# Patient Record
Sex: Female | Born: 1935 | Race: White | Hispanic: No | Marital: Married | Smoking: Never smoker
Health system: Southern US, Community
[De-identification: ages and names within clinical notes are randomized; demographics above are authoritative.]

## PROBLEM LIST (undated history)

## (undated) DIAGNOSIS — I639 Cerebral infarction, unspecified: Secondary | ICD-10-CM

## (undated) DIAGNOSIS — I714 Abdominal aortic aneurysm, without rupture, unspecified: Secondary | ICD-10-CM

## (undated) DIAGNOSIS — I2510 Atherosclerotic heart disease of native coronary artery without angina pectoris: Secondary | ICD-10-CM

## (undated) DIAGNOSIS — I10 Essential (primary) hypertension: Secondary | ICD-10-CM

## (undated) DIAGNOSIS — K219 Gastro-esophageal reflux disease without esophagitis: Secondary | ICD-10-CM

## (undated) DIAGNOSIS — Z87440 Personal history of urinary (tract) infections: Secondary | ICD-10-CM

## (undated) DIAGNOSIS — I4891 Unspecified atrial fibrillation: Secondary | ICD-10-CM

## (undated) HISTORY — PX: CORONARY ARTERY BYPASS GRAFT: SHX141

## (undated) HISTORY — DX: Unspecified atrial fibrillation: I48.91

## (undated) HISTORY — DX: Abdominal aortic aneurysm, without rupture, unspecified: I71.40

## (undated) HISTORY — DX: Atherosclerotic heart disease of native coronary artery without angina pectoris: I25.10

## (undated) HISTORY — DX: Essential (primary) hypertension: I10

## (undated) HISTORY — DX: Abdominal aortic aneurysm, without rupture: I71.4

## (undated) HISTORY — DX: Personal history of urinary (tract) infections: Z87.440

## (undated) HISTORY — DX: Gastro-esophageal reflux disease without esophagitis: K21.9

## (undated) HISTORY — DX: Cerebral infarction, unspecified: I63.9

---

## 2006-08-30 DIAGNOSIS — I639 Cerebral infarction, unspecified: Secondary | ICD-10-CM

## 2006-08-30 HISTORY — DX: Cerebral infarction, unspecified: I63.9

## 2009-11-19 ENCOUNTER — Ambulatory Visit: Payer: Self-pay | Admitting: Vascular Surgery

## 2009-12-10 ENCOUNTER — Ambulatory Visit: Payer: Self-pay | Admitting: Vascular Surgery

## 2009-12-10 ENCOUNTER — Encounter: Admission: RE | Admit: 2009-12-10 | Discharge: 2009-12-10 | Payer: Self-pay | Admitting: Vascular Surgery

## 2009-12-26 ENCOUNTER — Ambulatory Visit (HOSPITAL_COMMUNITY): Admission: RE | Admit: 2009-12-26 | Discharge: 2009-12-26 | Payer: Self-pay | Admitting: Vascular Surgery

## 2009-12-26 ENCOUNTER — Ambulatory Visit: Payer: Self-pay | Admitting: Vascular Surgery

## 2009-12-26 HISTORY — PX: OTHER SURGICAL HISTORY: SHX169

## 2010-01-01 ENCOUNTER — Inpatient Hospital Stay (HOSPITAL_COMMUNITY): Admission: RE | Admit: 2010-01-01 | Discharge: 2010-01-06 | Payer: Self-pay | Admitting: Vascular Surgery

## 2010-01-01 HISTORY — PX: ABDOMINAL AORTIC ANEURYSM REPAIR: SUR1152

## 2010-01-28 ENCOUNTER — Ambulatory Visit: Payer: Self-pay | Admitting: Vascular Surgery

## 2010-01-28 ENCOUNTER — Encounter: Admission: RE | Admit: 2010-01-28 | Discharge: 2010-01-28 | Payer: Self-pay | Admitting: Vascular Surgery

## 2010-07-29 IMAGING — CT CT ANGIO PELVIS
1 of 9 series · 11 of 36 positions shown, 17 images · IV contrast ([ID] OMNI 300)
Comparison: 01/01/2010 and 12/10/2009

CTA ABDOMEN

CLINICAL DATA: Postop AAA.  Constipation and diarrhea.

CT ANGIOGRAPHY ABDOMEN AND PELVIS
TECHNIQUE: Multidetector CT imaging of the abdomen and pelvis was
performed using the standard protocol during bolus administration
of intravenous contrast.  Multiplanar reconstructed images
including MIPs were obtained and reviewed to evaluate the vascular
anatomy.
Contrast:  100 ml [id number]

[Series 5: angio · axial · 0.70mm/px · z∈[-384,-12]mm · 11 of 244 slices shown, 17 images]
[im 21/244  soft-tissue]
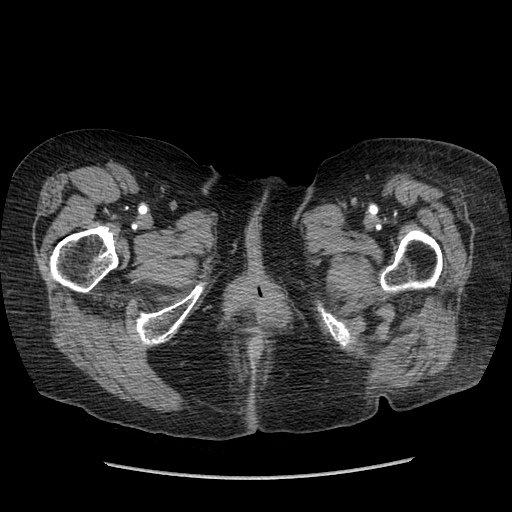
[im 21/244  bone]
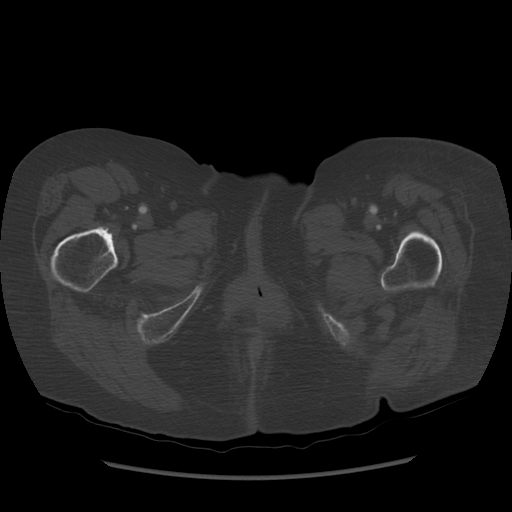
[im 41/244  soft-tissue]
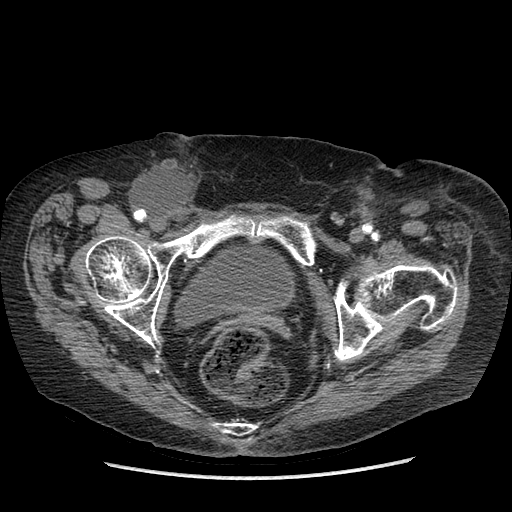
[im 61/244  soft-tissue]
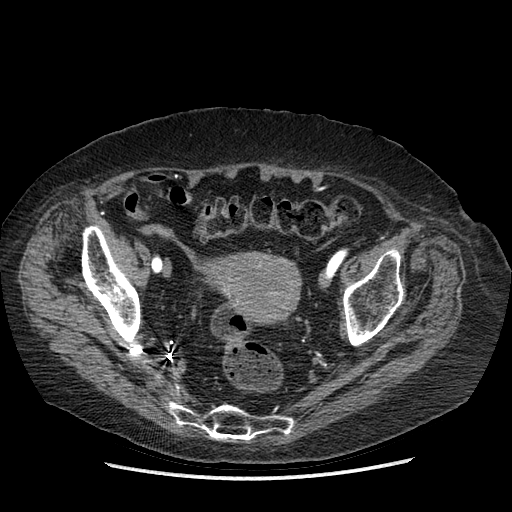
[im 82/244  soft-tissue]
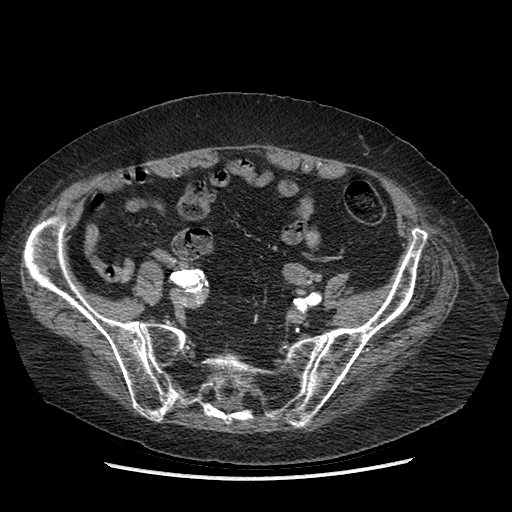
[im 102/244  soft-tissue]
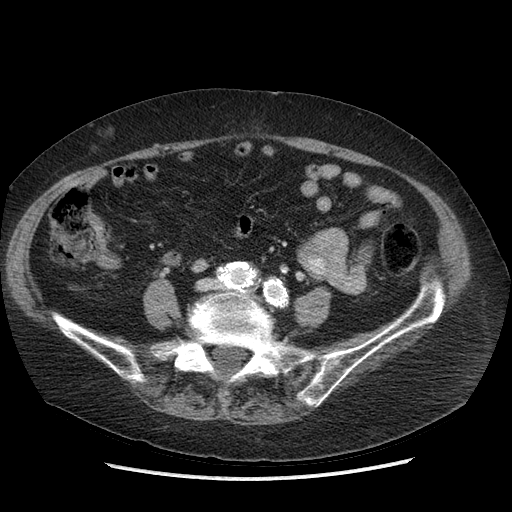
[im 122/244  soft-tissue]
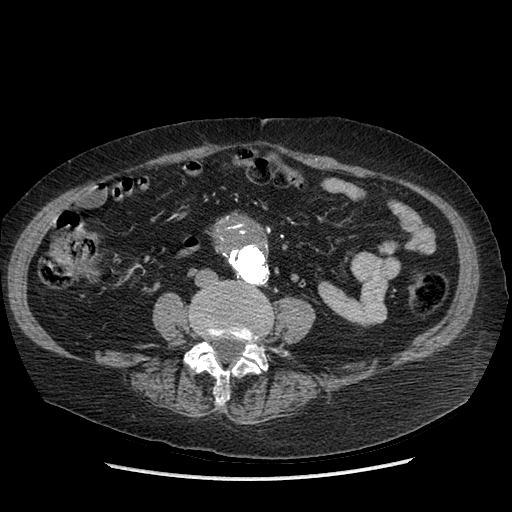
[im 142/244  soft-tissue]
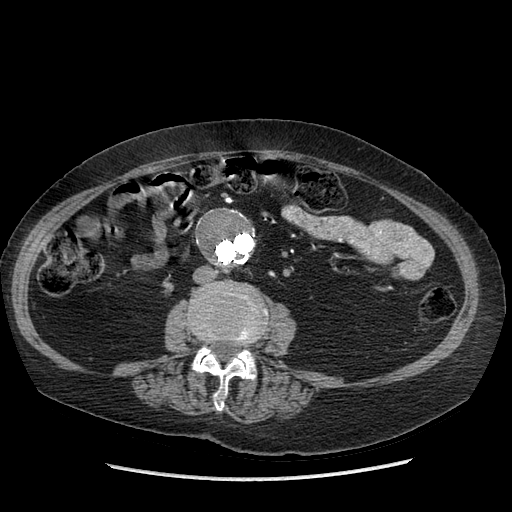
[im 163/244  soft-tissue]
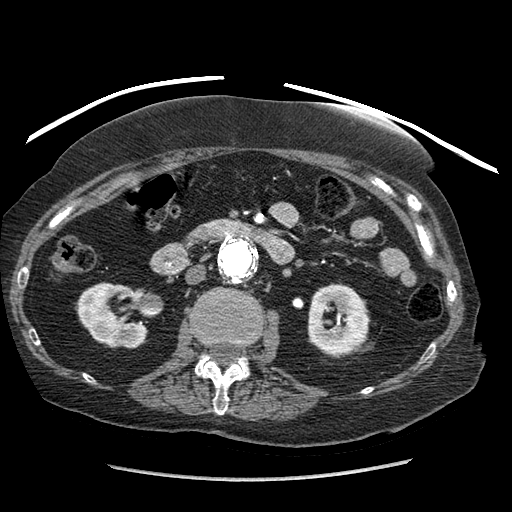
[im 163/244  lung]
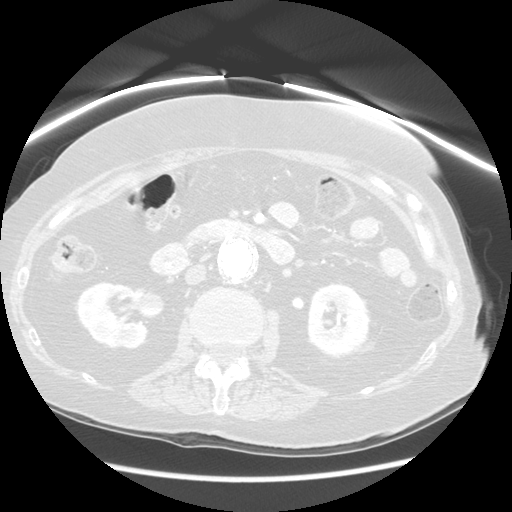
[im 183/244  soft-tissue]
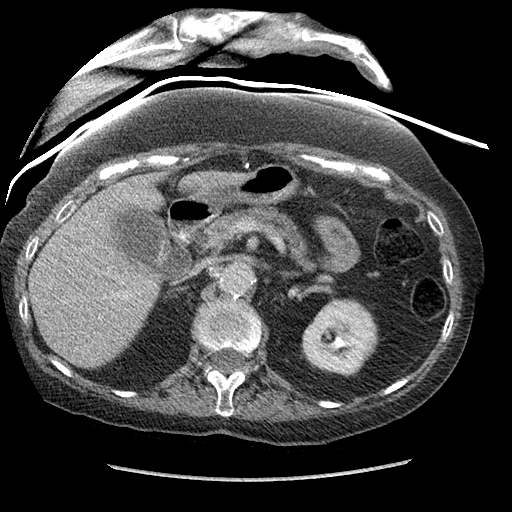
[im 183/244  lung]
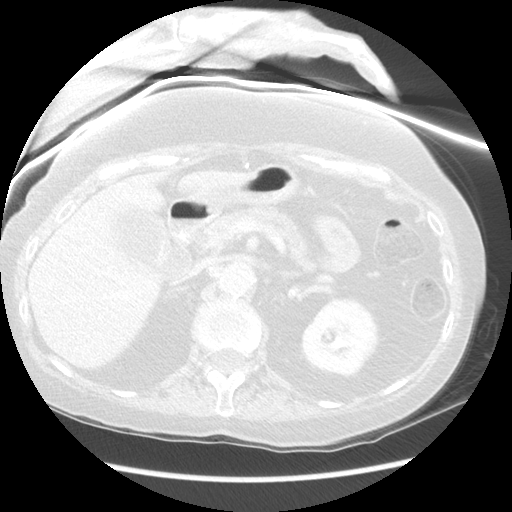
[im 183/244  bone]
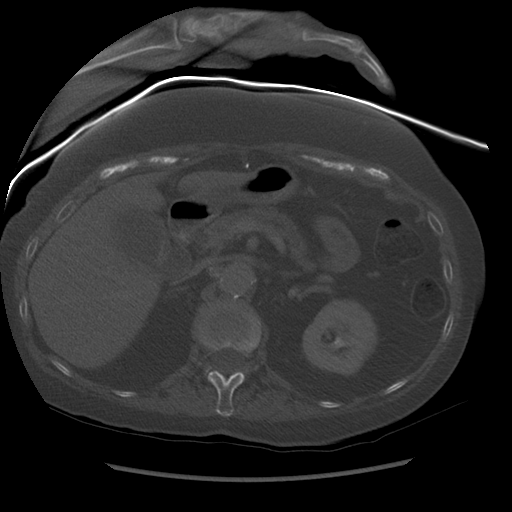
[im 203/244  soft-tissue]
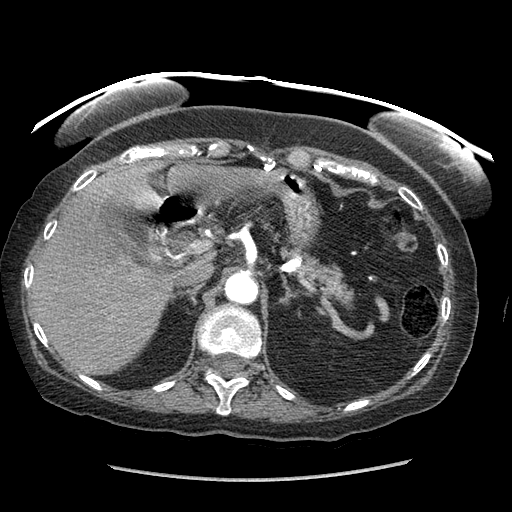
[im 203/244  lung]
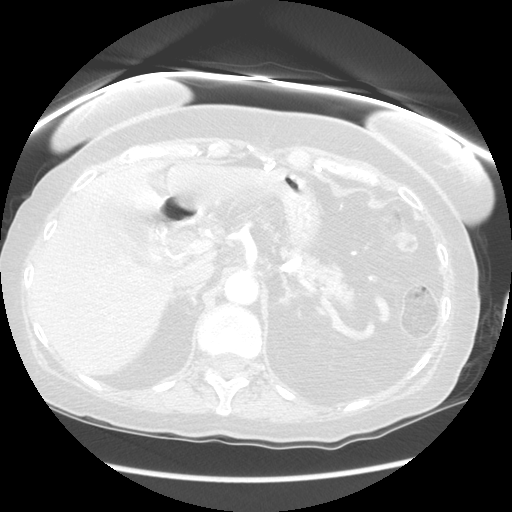
[im 223/244  soft-tissue]
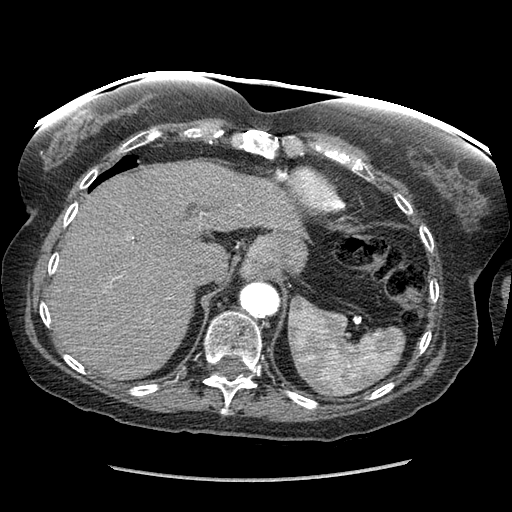
[im 223/244  lung]
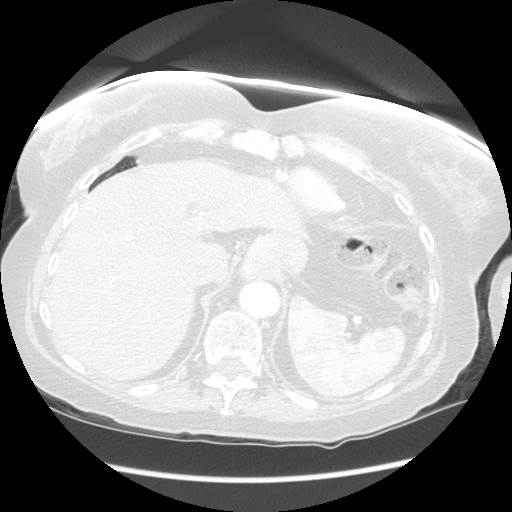

[11 of 36 positions shown; findings below may reference images not displayed]

FINDINGS: Lung bases show scattered scarring.  Heart is mildly
enlarged.  No pericardial or pleural effusion.  Small hiatal
hernia.

Liver is unremarkable.  Mild intrahepatic biliary duct dilatation.
Gallbladder is mildly distended, as before.  Extrahepatic bile duct
measures approximately 9 mm.  Adrenal glands, kidneys, spleen,
pancreas, stomach and small bowel are unremarkable.  A
periumbilical hernia contains fat.  No pathologically enlarged
lymph nodes.  No free fluid.  A nodular area of increased density
in the anterior subcutaneous fat of the right lower quadrant is
new, and may be iatrogenic in etiology.

There has been endovascular stent graft repair of the infrarenal
aorta.  The right iliac limb terminates in the right external iliac
artery.  Iliac limbs opacify symmetrically.  Native aneurysm sac
measures 4.5 x 5.1 cm (previously 4.7 x 5.2 cm).  No evidence of
endoleak.  Endovascular coils are seen in the right internal iliac
artery.  There is a thrombosed right internal iliac artery
aneurysm, measuring 2.7 cm, stable. Right common iliac artery
measures 2.3 cm, and left common iliac artery, 1.8 cm.

 Review of the MIP images confirms the above findings.
IMPRESSION: 1.  Interval endovascular stent graft repair of an infrarenal
aortic aneurysm, with slight decrease in size of the native
aneurysm sac.  No evidence of endoleak.
2.  Thrombosed right internal iliac artery aneurysm.  Right common
iliac artery aneurysm.
3.  Dilated gallbladder with mild intrahepatic and extrahepatic
biliary duct dilatation.  Please correlate clinically.
4.  Small periumbilical hernia contains fat.

CTA PELVIS
FINDINGS: There are postoperative changes in both inguinal regions.
A fluid collection in the right groin measures 4.6 x 3.9 cm and 5
HU.  Uterus and ovaries are visualized. Colon is unremarkable.  No
worrisome lytic or sclerotic lesions.  Osteopenia.

 Review of the MIP images confirms the above findings.
IMPRESSION: Fluid collection in the right groin likely represents a
postoperative seroma.

## 2010-08-13 ENCOUNTER — Ambulatory Visit: Payer: Self-pay | Admitting: Vascular Surgery

## 2010-09-20 ENCOUNTER — Encounter: Payer: Self-pay | Admitting: Vascular Surgery

## 2010-11-17 LAB — CBC
HCT: 27.3 % — ABNORMAL LOW (ref 36.0–46.0)
Hemoglobin: 11 g/dL — ABNORMAL LOW (ref 12.0–15.0)
Hemoglobin: 8 g/dL — ABNORMAL LOW (ref 12.0–15.0)
Hemoglobin: 8.6 g/dL — ABNORMAL LOW (ref 12.0–15.0)
MCHC: 33.9 g/dL (ref 30.0–36.0)
MCHC: 34 g/dL (ref 30.0–36.0)
MCV: 85.6 fL (ref 78.0–100.0)
MCV: 86.4 fL (ref 78.0–100.0)
Platelets: 183 10*3/uL (ref 150–400)
RBC: 2.97 MIL/uL — ABNORMAL LOW (ref 3.87–5.11)
RBC: 3.71 MIL/uL — ABNORMAL LOW (ref 3.87–5.11)
RDW: 15.9 % — ABNORMAL HIGH (ref 11.5–15.5)
RDW: 16 % — ABNORMAL HIGH (ref 11.5–15.5)
WBC: 5.3 10*3/uL (ref 4.0–10.5)

## 2010-11-17 LAB — BLOOD GAS, ARTERIAL
FIO2: 0.21 %
O2 Saturation: 98.5 %
Patient temperature: 98.6
pO2, Arterial: 86.8 mmHg (ref 80.0–100.0)

## 2010-11-17 LAB — BASIC METABOLIC PANEL
BUN: 6 mg/dL (ref 6–23)
CO2: 23 mEq/L (ref 19–32)
CO2: 24 mEq/L (ref 19–32)
CO2: 24 mEq/L (ref 19–32)
Calcium: 9 mg/dL (ref 8.4–10.5)
Chloride: 108 mEq/L (ref 96–112)
Chloride: 112 mEq/L (ref 96–112)
Creatinine, Ser: 0.77 mg/dL (ref 0.4–1.2)
Creatinine, Ser: 0.89 mg/dL (ref 0.4–1.2)
GFR calc Af Amer: >60 mL/min (ref >60)
GFR calc non Af Amer: >60 mL/min (ref >60)
GFR calc non Af Amer: >60 mL/min (ref >60)
Glucose, Bld: 102 mg/dL — ABNORMAL HIGH (ref 70–99)
Glucose, Bld: 111 mg/dL — ABNORMAL HIGH (ref 70–99)
Glucose, Bld: 95 mg/dL (ref 70–99)
Potassium: 3 mEq/L — ABNORMAL LOW (ref 3.5–5.1)
Potassium: 3.4 mEq/L — ABNORMAL LOW (ref 3.5–5.1)
Sodium: 140 mEq/L (ref 135–145)

## 2010-11-17 LAB — POCT I-STAT, CHEM 8
Glucose, Bld: 95 mg/dL (ref 70–99)
TCO2: 28 mmol/L (ref 0–100)

## 2010-11-17 LAB — COMPREHENSIVE METABOLIC PANEL
ALT: 11 U/L (ref 0–35)
Albumin: 3.2 g/dL — ABNORMAL LOW (ref 3.5–5.2)
Alkaline Phosphatase: 78 U/L (ref 39–117)
BUN: 10 mg/dL (ref 6–23)
CO2: 22 mEq/L (ref 19–32)
Chloride: 108 mEq/L (ref 96–112)
GFR calc Af Amer: >60 mL/min (ref >60)
Glucose, Bld: 96 mg/dL (ref 70–99)
Potassium: 3.8 mEq/L (ref 3.5–5.1)
Total Protein: 6.5 g/dL (ref 6.0–8.3)

## 2010-11-17 LAB — APTT: aPTT: 28 seconds (ref 24–37)

## 2010-11-17 LAB — PROTIME-INR
INR: 1.64 — ABNORMAL HIGH (ref 0.00–1.49)
Prothrombin Time: 19.3 seconds — ABNORMAL HIGH (ref 11.6–15.2)
Prothrombin Time: 14.9 seconds (ref 11.6–15.2)

## 2010-11-17 LAB — TYPE AND SCREEN: Antibody Screen: NEGATIVE

## 2011-01-12 NOTE — Assessment & Plan Note (Signed)
OFFICE VISIT   Sharon Miles, Sharon Miles  DOB:  01/28/1936                                       12/10/2009  CHART#:[id number]   The patient returns for further followup today.  We have been following  her for an abdominal aortic aneurysm.  She returns today after being  seen by Dr. Dulce Sellar with cardiology.  She returns for further followup  today after cardiology evaluation.  She states that this evaluation  checked out and there were no real interventions planned.  I have not  yet received the note from Dr. Dulce Sellar requiring her office visit.  She  continues to deny any abdominal or back pain.  She has a known  infrarenal abdominal aortic aneurysm with bilateral common iliac  aneurysms and a right internal iliac artery aneurysm.  She is overall  fairly debilitated with a left hemiplegia from a previous stroke in  2008.  She is on Coumadin for atrial fibrillation.   She currently denies any shortness of breath or chest pain.   PHYSICAL EXAM:  Vital signs:  Blood pressure is 115/81 in the left arm,  heart rate 76 and regular, respirations 18.  Abdomen:  Soft, nontender,  nondistended with palpable pulsatile epigastric mass.   She had a CT angiogram of the abdomen and pelvis today for planning  purposes for aneurysm stent graft.  The neck has a good length, however,  the diameter is approximately 28-29 mm in diameter.  I believe this  still would be suitable for the Gore excluder graft.  She also has a  large right internal iliac artery aneurysm which will need to be coil  embolized in a staged fashion prior to her stent graft repair.  We have  forwarded a copy of her CT angiogram to the [employer] company for measuring  of her device.  Currently the plan is to perform a coil embolization of  her right internal iliac artery on April 28.  Will then plan to place  her stent graft on 01/01/2010.  Risks, benefits, possible complications,  procedure details and the reason for  staging the procedure as well as  diagrams of the patient's aneurysmal disease were explained to the  patient today as well as her husband.  They understand and agreed to  proceed.     Janetta Hora. Fields, MD  Electronically Signed   CEF/MEDQ  D:  12/11/2009  T:  12/11/2009  Job:  [id number]   cc:   Dr Tomasa Blase  Dr Dulce Sellar

## 2011-01-12 NOTE — Assessment & Plan Note (Signed)
OFFICE VISIT   Sharon Miles, Sharon Miles  DOB:  1935-12-19                                       11/19/2009  CHART#:[id number]   CHIEF COMPLAINT:  Abdominal aortic aneurysm.   HISTORY OF PRESENT ILLNESS:  The patient is a 75 year old female  referred by Dr. Tomasa Blase for evaluation of abdominal aortic aneurysm.  The patient had a recent history of episodes of abdominal pain and had a  CT scan performed at [facility] which showed an abdominal aortic  aneurysm.  The patient had a known aneurysm prior to this which  apparently was 5.1 cm in diameter.  This was found approximately a year  ago.  At that point the patient refused to have an operation.  On recent  ultrasound at [facility] she was found to have the aneurysm  growing to 6.6 cm in diameter from previous 5.1 cm in diameter.  She  also had bilateral common iliac artery aneurysms which were 2.2 cm in  diameter.  The patient currently does not have any abdominal pain and  this resolved from her previous episode.  She is overall fairly  debilitated with a left hemiplegia from a stroke after having coronary  artery bypass grafting in 2008.  Her cognitive function is very good.  However, from a physical standpoint she is fairly debilitated from this.  She also has bowel and bladder incontinence.  Chronic medical problems  include multiple urinary tract infections, hypertension, coronary artery  disease.  These are all currently controlled.   MEDICATIONS:  1. Coumadin.  2. Vicodin.  3. Diltiazem.  4. MiraLax.  5. Toprol XL.  6. Vesicare.  7. Fish oil concentrate.  8. Mobic.  9. Flexeril.  10.Fosamax.  11.Protonix.  12.Keppra.  She is on nitrofurantoin for chronic UTI prophylaxis.  She is on  Coumadin for chronic atrial fibrillation.   ALLERGIES:  She has no known drug allergies.   FAMILY HISTORY:  Is unremarkable for aneurysm disease.   PAST SURGICAL HISTORY:  Coronary artery bypass  grafting in 2008.   SOCIAL HISTORY:  She is married and has five children.  She is disabled  as mentioned above.  She is a nonsmoker, non consumer of alcohol.   Full 12-point review of systems was performed with the patient today.  Please see intake referral form for details regarding that.   PHYSICAL EXAM:  VITAL SIGNS:  Blood pressure 112/78 in the left arm,  heart rate 76 and regular, respirations 16.  HEENT:  Unremarkable.  NECK:  Has 2+ carotid pulses without bruit.  CHEST:  Clear to auscultation.  HEART:  Exam is irregularly irregular with no murmur.  ABDOMEN:  Soft, nontender, nondistended with vaguely palpable pulsatile  epigastric mass.  EXTREMITIES:  She has difficult to palpate femoral, popliteal and pedal  pulses bilaterally.  She has trace edema in both lower extremities.  MUSCULOSKELETAL:  Shows no obvious major joint deformities.  NEUROLOGY:  She has a complete left hemiplegia.  SKIN:  Has no open ulcers or rashes.  She has no obvious cervical,  axillary, inguinal lymphadenopathy.   I had a lengthy discussion with the patient and husband today.  I do not  believe that she is necessarily a very good candidate for open aneurysm  repair due to her overall debility for hemiplegia.  We will repeat her  CT scan  as this has been previously done with 5 mm cuts.  The CT scan  did suggest that an endovascular stent graft might be possible.  However, this did show common iliac and internal iliac artery aneurysm  that would need to addressed at the same time as well.  If on the CT  scan she is not amenable to endovascular stent graft repair, we will  again have discussions with the patient and family to see whether or not  an open repair is in her best interest.  She will return for follow-up  after her CT scan.  We have also arranged for her see Dr. Norman Herrlich  with cardiology for an overall risk stratification exam prior to  considering her operation.     Janetta Hora.  Fields, MD  Electronically Signed   CEF/MEDQ  D:  11/20/2009  T:  11/20/2009  Job:  [id number]

## 2011-01-12 NOTE — Assessment & Plan Note (Signed)
OFFICE VISIT   Sharon Miles, Sharon Miles  DOB:  04-Mar-1936                                       01/28/2010  CHART#:[id number]   The patient returns for followup today.  She underwent aortic stent  graft repair with a Gore excluder graft on Jan 01, 2010.  She had a  fairly uneventful postoperative recovery.  Her husband  is with her for  her office visit today and states that she was fairly weak when she went  home but she has started to recover some of her strength.  She has still  had somewhat poor appetite but they were trying to improve on this.  She  did develop a small decubitus ulcer right over her coccyx in the  hospital, and this is slowly healing.  Groin incisions have not had any  drainage and are apparently well-healed.   She had a CT angiogram of the abdomen and pelvis performed today.  I  reviewed the images of this.  This shows the aneurysm is 4.5 x 5.1 cm in  diameter.  Preoperatively was 5.4 cm in diameter.  The stent graft is in  good position below the renal arteries.  The right common iliac artery  was 2.3 cm in diameter.  The right internal iliac artery is 2.7 cm in  diameter and occluded from coil embolization.  She did have a seroma in  the groin.   On physical exam, blood pressure is 100/74 in the right arm, heart rate  67 and regular.  Temperature is 97.4.  Left and right groin incisions  are well-healed.  She has 2+ femoral pulses.  She has absent pedal  pulses bilaterally.   Overall, the patient appears well at this point.  She is slowly  improving overall.  She is still fairly debilitated from her previous  stroke but overall has some reasonable functional status and quality of  life.  Hopefully, the aneurysm repair will be a durable one for her.  We  will see her back in 6 months' time for a repeat CT angiogram of the  abdomen and pelvis.  I encouraged her today to continue to increase her  p.o. intake, which her nutritional status  will continue to help heal her  decubitus ulcer as well as her overall strength and conditioning.     Janetta Hora. Fields, MD  Electronically Signed   CEF/MEDQ  D:  01/28/2010  T:  01/29/2010  Job:  [id number]   cc:   Dr. Dulce Sellar  Dr. Tomasa Blase

## 2011-02-18 ENCOUNTER — Ambulatory Visit (INDEPENDENT_AMBULATORY_CARE_PROVIDER_SITE_OTHER): Payer: Medicare Other | Admitting: Vascular Surgery

## 2011-02-18 ENCOUNTER — Encounter (INDEPENDENT_AMBULATORY_CARE_PROVIDER_SITE_OTHER): Payer: Medicare Other

## 2011-02-18 DIAGNOSIS — I714 Abdominal aortic aneurysm, without rupture: Secondary | ICD-10-CM

## 2011-02-18 DIAGNOSIS — Z48812 Encounter for surgical aftercare following surgery on the circulatory system: Secondary | ICD-10-CM

## 2011-02-19 NOTE — Assessment & Plan Note (Signed)
OFFICE VISIT  BELLAH, Sharon Miles DOB:  [birth year]                                       02/18/2011 CHART#:[id number]  The patient previously underwent placement of a Gore excluder stent graft for iliac and abdominal aortic aneurysms in May of 2011.  She returns today for further followup.  She denies any abdominal or back pain.  She is overall fairly debilitated with a chronic left hemiplegia from previous stroke after coronary artery bypass grafting in 2008.  She is currently residing in a nursing home.  She is essentially wheelchair bound at this point.  PHYSICAL EXAM:  Blood pressure is 146/81 in the right arm, heart rate 68 and regular, oxygen saturation is 95% on room air.  Abdomen:  Soft, nontender, nondistended.  No pulsatile mass.  Feet are pink, warm and well-perfused bilaterally.  She had an abdominal aortic ultrasound today in our office which I reviewed and interpreted.  This shows the aneurysm is currently 5.0 cm. There is no evidence of type 1 or type 2 endoleak.  Overall the patient appears to be doing well at this point.  She will follow up with a CT angiogram abdomen and pelvis in 1 year's time.  She will return sooner if she has any problems.    Janetta Hora. Ger Ringenberg, MD Electronically Signed  CEF/MEDQ  D:  02/18/2011  T:  02/19/2011  Job:  [id number]  cc:   Dr Zachery Dakins Dr Tomasa Blase

## 2011-02-26 NOTE — Procedures (Unsigned)
VASCULAR LAB EXAM  INDICATION:  Followup abdominal aortic aneurysm endograft placed 01/01/2010.  HISTORY: Diabetes:  No. Cardiac:  CABG. Hypertension:  Yes.  EXAM:  AAA sac size 4.5 cm AP, 5.0 cm transverse.  Previous sac size was 4.5 cm AP, 5.1 cm transverse.  IMPRESSION:  The aorta endograft appeared patent.  No significant change in the size of the aneurysmal sac surrounding the endograft.  No evidence of endoleak was detected.  ___________________________________________ Janetta Hora. Fields, MD  OD/MEDQ  D:  02/22/2011  T:  02/22/2011  Job:  [id number]

## 2011-11-22 ENCOUNTER — Other Ambulatory Visit: Payer: Self-pay | Admitting: *Deleted

## 2011-11-22 DIAGNOSIS — I714 Abdominal aortic aneurysm, without rupture: Secondary | ICD-10-CM

## 2012-02-04 ENCOUNTER — Encounter: Payer: Self-pay | Admitting: Vascular Surgery

## 2012-02-16 ENCOUNTER — Encounter: Payer: Self-pay | Admitting: Vascular Surgery

## 2012-02-17 ENCOUNTER — Ambulatory Visit (INDEPENDENT_AMBULATORY_CARE_PROVIDER_SITE_OTHER): Payer: Medicare Other | Admitting: Vascular Surgery

## 2012-02-17 ENCOUNTER — Ambulatory Visit
Admission: RE | Admit: 2012-02-17 | Discharge: 2012-02-17 | Disposition: A | Discharge status: Home / Self Care | Payer: Medicare Other | Source: Ambulatory Visit | Attending: Vascular Surgery | Admitting: Vascular Surgery

## 2012-02-17 ENCOUNTER — Encounter: Payer: Self-pay | Admitting: Vascular Surgery

## 2012-02-17 VITALS — BP 140/63 | HR 83 | Resp 16 | Ht 62.0 in | Wt 170.0 lb

## 2012-02-17 DIAGNOSIS — I714 Abdominal aortic aneurysm, without rupture: Secondary | ICD-10-CM

## 2012-02-17 DIAGNOSIS — Z48812 Encounter for surgical aftercare following surgery on the circulatory system: Secondary | ICD-10-CM

## 2012-02-17 MED ORDER — IOHEXOL 350 MG/ML SOLN
100.0000 mL | Freq: Once | INTRAVENOUS | Status: AC | PRN
  Administered 2012-02-17: 100 mL via INTRAVENOUS

## 2012-02-17 NOTE — Addendum Note (Signed)
Addended by: Sharee Pimple on: 02/17/2012 01:43 PM   Modules accepted: Orders

## 2012-02-17 NOTE — Progress Notes (Signed)
[facility] [place] HISTORY AND PHYSICAL    History of Present Illness:  Patient is a 75 y.o.76 y.o. year old female who presents for follow-up evaluation of AAA. She underwent Gore Excluder aneurysm stent graft repair in 11/2009. She also had coil embolization of her right internal iliac artery. The patient denies new abdominal or back pain.  The patient's atherosclerotic risk factors remain prior stroke, hypertension, coronary disease, atrial fibrillation.  These are all currently stable and followed by her primary care physician.   Past Medical History  Diagnosis Date  . AAA (abdominal aortic aneurysm)   . Stroke 2008  . Hypertension   . CAD (coronary artery disease)   . Atrial fibrillation   . GERD (gastroesophageal reflux disease)   . Hx of urinary tract infection      Past Surgical History  Procedure Date  . Abdominal aortic aneurysm repair 01/01/2010  . Abdominal aortogram 12/26/2009  . Coronary artery bypass graft        Review of Systems:  Neurologic: denies symptoms of TIA, amaurosis, or stroke Cardiac:denies shortness of breath or chest pain Pulmonary: denies cough or wheeze Abdomen: denies abdominal pain nausea or vomiting  History   Social History  . Marital Status: Married    Spouse Name: N/A    Number of Children: N/A  . Years of Education: N/A   Occupational History  . Not on file.   Social History Main Topics  . Smoking status: Never Smoker   . Smokeless tobacco: Never Used  . Alcohol Use: No  . Drug Use: No  . Sexually Active: Not on file   Other Topics Concern  . Not on file   Social History Narrative  . No narrative on file    No Known Allergies  Current Outpatient Prescriptions on File Prior to Visit  Medication Sig Dispense Refill  . darifenacin (ENABLEX) 15 MG 24 hr tablet Take 15 mg by mouth daily.      Marland Kitchen diltiazem (TIAZAC) 240 MG 24 hr capsule Take 240 mg by mouth daily.      . fish oil-omega-3 fatty acids 1000  MG capsule Take 2 g by mouth daily.      . furosemide (LASIX) 40 MG tablet Take 40 mg by mouth daily.      Marland Kitchen levETIRAcetam (KEPPRA) 500 MG tablet Take 500 mg by mouth every 12 (twelve) hours.      . metoprolol succinate (TOPROL-XL) 25 MG 24 hr tablet Take 25 mg by mouth daily.      . nitrofurantoin (MACRODANTIN) 100 MG capsule Take 100 mg by mouth 2 (two) times daily.      Marland Kitchen warfarin (COUMADIN) 5 MG tablet Take 5 mg by mouth daily. As directed       No current facility-administered medications on file prior to visit.       Physical Examination    Filed Vitals:   02/17/12 1232  BP: 140/63  Pulse: 83  Resp: 16  Height: 5\' 2"  (1.575 m)  Weight: 170 lb (77.111 kg)     General:  Alert and oriented, no acute distress HEENT: Normal Neck: No bruit or JVD Pulmonary: Clear to auscultation bilaterally Cardiac: Regular Rate and Rhythm without murmur Abdomen: Soft, non-tender, non-distended, normal bowel sounds, no pulsatile mass Extremities: 2+ femoral pulses   DATA:  CT angiogram the abdomen and pelvis images were reviewed today. Current aneurysm diameter is  4.9cm compared to 5.3 cm preoperatively. The right internal iliac artery aneurysm  is occluded and well sealed.  There is no  evidence of endoleak. The top portion of the stent graft is adjacent to the renal arteries and there is no evidence of migration.   ASSESSMENT:  Doing well status post Gore Excluder stent graft repair aneurysm   PLAN: Patient will return in one year to review her stent graft.   Fabienne Bruns, MD [facility] [place] Office: [phone] Pager: [phone]

## 2013-02-22 ENCOUNTER — Ambulatory Visit: Payer: Medicare Other | Admitting: Neurosurgery

## 2013-03-29 ENCOUNTER — Encounter (INDEPENDENT_AMBULATORY_CARE_PROVIDER_SITE_OTHER): Payer: Medicare Other | Admitting: *Deleted

## 2013-03-29 ENCOUNTER — Other Ambulatory Visit: Payer: Self-pay | Admitting: *Deleted

## 2013-03-29 DIAGNOSIS — Z48812 Encounter for surgical aftercare following surgery on the circulatory system: Secondary | ICD-10-CM

## 2013-03-29 DIAGNOSIS — I714 Abdominal aortic aneurysm, without rupture: Secondary | ICD-10-CM

## 2013-04-03 ENCOUNTER — Other Ambulatory Visit: Payer: Self-pay | Admitting: *Deleted

## 2013-04-03 DIAGNOSIS — I714 Abdominal aortic aneurysm, without rupture: Secondary | ICD-10-CM

## 2013-04-03 DIAGNOSIS — Z48812 Encounter for surgical aftercare following surgery on the circulatory system: Secondary | ICD-10-CM

## 2013-04-09 ENCOUNTER — Other Ambulatory Visit: Payer: Self-pay | Admitting: Vascular Surgery

## 2013-04-09 DIAGNOSIS — I714 Abdominal aortic aneurysm, without rupture, unspecified: Secondary | ICD-10-CM

## 2013-04-10 ENCOUNTER — Encounter: Payer: Self-pay | Admitting: Vascular Surgery

## 2013-04-13 ENCOUNTER — Other Ambulatory Visit: Payer: Self-pay | Admitting: *Deleted

## 2014-03-28 ENCOUNTER — Other Ambulatory Visit: Payer: Self-pay | Admitting: *Deleted

## 2014-03-28 ENCOUNTER — Ambulatory Visit (HOSPITAL_COMMUNITY)
Admission: RE | Admit: 2014-03-28 | Discharge: 2014-03-28 | Disposition: A | Discharge status: Home / Self Care | Payer: Medicare Other | Source: Ambulatory Visit | Attending: Vascular Surgery | Admitting: Vascular Surgery

## 2014-03-28 DIAGNOSIS — I714 Abdominal aortic aneurysm, without rupture, unspecified: Secondary | ICD-10-CM | POA: Insufficient documentation

## 2014-03-28 DIAGNOSIS — Z48812 Encounter for surgical aftercare following surgery on the circulatory system: Secondary | ICD-10-CM

## 2014-05-01 ENCOUNTER — Encounter: Payer: Self-pay | Admitting: Vascular Surgery

## 2014-05-02 ENCOUNTER — Ambulatory Visit (INDEPENDENT_AMBULATORY_CARE_PROVIDER_SITE_OTHER): Payer: Medicare Other | Admitting: Vascular Surgery

## 2014-05-02 ENCOUNTER — Encounter: Payer: Self-pay | Admitting: Vascular Surgery

## 2014-05-02 ENCOUNTER — Ambulatory Visit
Admission: RE | Admit: 2014-05-02 | Discharge: 2014-05-02 | Disposition: A | Discharge status: Home / Self Care | Payer: Medicare Other | Source: Ambulatory Visit | Attending: Vascular Surgery | Admitting: Vascular Surgery

## 2014-05-02 VITALS — BP 127/68 | HR 76 | Temp 98.1°F | Resp 16 | Ht 62.0 in | Wt 175.0 lb

## 2014-05-02 DIAGNOSIS — I714 Abdominal aortic aneurysm, without rupture, unspecified: Secondary | ICD-10-CM

## 2014-05-02 DIAGNOSIS — Z48812 Encounter for surgical aftercare following surgery on the circulatory system: Secondary | ICD-10-CM

## 2014-05-02 MED ORDER — IOHEXOL 350 MG/ML SOLN
80.0000 mL | Freq: Once | INTRAVENOUS | Status: AC | PRN
Start: 2014-05-02 — End: 2014-05-02
  Administered 2014-05-02: 80 mL via INTRAVENOUS

## 2014-05-02 NOTE — Progress Notes (Signed)
[facility] [place] HISTORY AND PHYSICAL    History of Present Illness:  Patient is a 78 year old female who presents for follow-up evaluation of AAA. She underwent Gore Excluder aneurysm stent graft repair in 11/2009. She also had coil embolization of her right internal iliac artery. The patient denies new abdominal or back pain.  The patient's atherosclerotic risk factors remain prior stroke, hypertension, coronary disease, atrial fibrillation.  These are all currently stable and followed by her primary care physician. She is overall very debilitated. She currently lives in a skilled nursing facility. She is non-ambulatory. She has no use of her left upper extremity. She does have some use of her right upper extremity.    Past Medical History   Diagnosis  Date   .  AAA (abdominal aortic aneurysm)     .  Stroke  2008   .  Hypertension     .  CAD (coronary artery disease)     .  Atrial fibrillation     .  GERD (gastroesophageal reflux disease)     .  Hx of urinary tract infection         Past Surgical History   Procedure  Date   .  Abdominal aortic aneurysm repair  01/01/2010   .  Abdominal aortogram  12/26/2009   .  Coronary artery bypass graft           Review of Systems:  Neurologic: denies symptoms of TIA, amaurosis, or stroke Cardiac:denies shortness of breath or chest pain Pulmonary: denies cough or wheeze Abdomen: denies abdominal pain nausea or vomiting    History      Social History   .  Marital Status:  Married       Spouse Name:  N/A       Number of Children:  N/A   .  Years of Education:  N/A      Occupational History   .  Not on file.      Social History Main Topics   .  Smoking status:  Never Smoker    .  Smokeless tobacco:  Never Used   .  Alcohol Use:  No   .  Drug Use:  No   .  Sexually Active:  Not on file      Other Topics  Concern   .  Not on file      Social History Narrative   .  No narrative on file     No Known  Allergies     Current Outpatient Prescriptions on File Prior to Visit  Medication Sig Dispense Refill  . darifenacin (ENABLEX) 15 MG 24 hr tablet Take 15 mg by mouth daily.      Marland Kitchen diltiazem (TIAZAC) 240 MG 24 hr capsule Take 240 mg by mouth daily.      . fish oil-omega-3 fatty acids 1000 MG capsule Take 2 g by mouth daily.      . furosemide (LASIX) 40 MG tablet Take 40 mg by mouth daily.      Marland Kitchen levETIRAcetam (KEPPRA) 500 MG tablet Take 500 mg by mouth every 12 (twelve) hours.      . metoprolol succinate (TOPROL-XL) 25 MG 24 hr tablet Take 25 mg by mouth daily.      . nitrofurantoin (MACRODANTIN) 100 MG capsule Take 100 mg by mouth 2 (two) times daily.      Marland Kitchen warfarin (COUMADIN) 5 MG tablet Take 5 mg by mouth daily. As directed  No current facility-administered medications on file prior to visit.       Physical Examination      Filed Vitals:   05/02/14 1340  BP: 127/68  Pulse: 76  Temp: 98.1 F (36.7 C)  TempSrc: Oral  Resp: 16  Height:  (1.575 m)  Weight: 175 lb (79.379 kg)  SpO2: 98%   General:  Alert and oriented, no acute distress HEENT: Normal Neck: No bruit or JVD Pulmonary: Clear to auscultation bilaterally Abdomen: Soft, non-tender, non-distended, normal bowel sounds, no pulsatile mass Extremities: Unable to do reasonable pulse exam due to the fact the patient is confined to a wheelchair  DATA:   CT angiogram the abdomen and pelvis images were reviewed today. Current aneurysm diameter is  4.5cm compared to 5.3 cm preoperatively. The right internal iliac artery aneurysm is occluded and well sealed.  There is no  evidence of endoleak. The top portion of the stent graft is adjacent to the renal arteries and there is no evidence of migration.  Incidentally a right renal stone is identified in the right ureter with some mild hydronephrosis but contrast does get around the stone   ASSESSMENT:   Doing well status post Gore Excluder stent graft repair aneurysm.   Incidental asymptomatic right renal stone   PLAN: At this point the patient is very debilitated. Obtain CT scans is quite a burden for her. She also probably is not really a candidate for any further revision. Her aneurysm has been stable for several years. At this point I think she can be followed on as-needed basis. We will leave further evaluation and workup to her primary care physician regarding a renal stone if necessary. However currently this seems to be asymptomatic.   Fabienne Bruns, MD [facility] [place] Office: [phone] Pager: [phone]

## 2014-09-12 DIAGNOSIS — R791 Abnormal coagulation profile: Secondary | ICD-10-CM | POA: Diagnosis not present

## 2014-09-14 DIAGNOSIS — I10 Essential (primary) hypertension: Secondary | ICD-10-CM | POA: Diagnosis not present

## 2014-09-14 DIAGNOSIS — K5900 Constipation, unspecified: Secondary | ICD-10-CM | POA: Diagnosis not present

## 2014-09-14 DIAGNOSIS — E785 Hyperlipidemia, unspecified: Secondary | ICD-10-CM | POA: Diagnosis not present

## 2014-09-26 DIAGNOSIS — R791 Abnormal coagulation profile: Secondary | ICD-10-CM | POA: Diagnosis not present

## 2014-10-28 DIAGNOSIS — Z7901 Long term (current) use of anticoagulants: Secondary | ICD-10-CM | POA: Diagnosis not present

## 2014-11-11 DIAGNOSIS — R791 Abnormal coagulation profile: Secondary | ICD-10-CM | POA: Diagnosis not present

## 2014-11-18 DIAGNOSIS — R791 Abnormal coagulation profile: Secondary | ICD-10-CM | POA: Diagnosis not present

## 2014-11-20 DIAGNOSIS — K219 Gastro-esophageal reflux disease without esophagitis: Secondary | ICD-10-CM | POA: Diagnosis not present

## 2014-11-20 DIAGNOSIS — I480 Paroxysmal atrial fibrillation: Secondary | ICD-10-CM | POA: Diagnosis not present

## 2014-11-20 DIAGNOSIS — Z79899 Other long term (current) drug therapy: Secondary | ICD-10-CM | POA: Diagnosis not present

## 2014-11-20 DIAGNOSIS — I69391 Dysphagia following cerebral infarction: Secondary | ICD-10-CM | POA: Diagnosis not present

## 2014-11-20 DIAGNOSIS — I639 Cerebral infarction, unspecified: Secondary | ICD-10-CM | POA: Diagnosis not present

## 2014-12-02 DIAGNOSIS — R791 Abnormal coagulation profile: Secondary | ICD-10-CM | POA: Diagnosis not present

## 2014-12-04 DIAGNOSIS — R791 Abnormal coagulation profile: Secondary | ICD-10-CM | POA: Diagnosis not present

## 2014-12-06 DIAGNOSIS — R791 Abnormal coagulation profile: Secondary | ICD-10-CM | POA: Diagnosis not present

## 2014-12-10 DIAGNOSIS — R791 Abnormal coagulation profile: Secondary | ICD-10-CM | POA: Diagnosis not present

## 2014-12-17 DIAGNOSIS — R791 Abnormal coagulation profile: Secondary | ICD-10-CM | POA: Diagnosis not present

## 2014-12-31 DIAGNOSIS — R791 Abnormal coagulation profile: Secondary | ICD-10-CM | POA: Diagnosis not present

## 2015-01-07 DIAGNOSIS — R791 Abnormal coagulation profile: Secondary | ICD-10-CM | POA: Diagnosis not present

## 2015-01-10 DIAGNOSIS — G40209 Localization-related (focal) (partial) symptomatic epilepsy and epileptic syndromes with complex partial seizures, not intractable, without status epilepticus: Secondary | ICD-10-CM | POA: Diagnosis not present

## 2015-01-10 DIAGNOSIS — I639 Cerebral infarction, unspecified: Secondary | ICD-10-CM | POA: Diagnosis not present

## 2015-01-10 DIAGNOSIS — Z9181 History of falling: Secondary | ICD-10-CM | POA: Diagnosis not present

## 2015-01-10 DIAGNOSIS — I480 Paroxysmal atrial fibrillation: Secondary | ICD-10-CM | POA: Diagnosis not present

## 2015-01-10 DIAGNOSIS — Z23 Encounter for immunization: Secondary | ICD-10-CM | POA: Diagnosis not present

## 2015-01-10 DIAGNOSIS — E785 Hyperlipidemia, unspecified: Secondary | ICD-10-CM | POA: Diagnosis not present

## 2015-01-10 DIAGNOSIS — Z79899 Other long term (current) drug therapy: Secondary | ICD-10-CM | POA: Diagnosis not present

## 2015-01-10 DIAGNOSIS — R791 Abnormal coagulation profile: Secondary | ICD-10-CM | POA: Diagnosis not present

## 2015-01-17 DIAGNOSIS — K3189 Other diseases of stomach and duodenum: Secondary | ICD-10-CM | POA: Diagnosis not present

## 2015-01-17 DIAGNOSIS — R6520 Severe sepsis without septic shock: Secondary | ICD-10-CM | POA: Diagnosis not present

## 2015-01-17 DIAGNOSIS — R112 Nausea with vomiting, unspecified: Secondary | ICD-10-CM | POA: Diagnosis not present

## 2015-01-17 DIAGNOSIS — K449 Diaphragmatic hernia without obstruction or gangrene: Secondary | ICD-10-CM | POA: Diagnosis not present

## 2015-01-17 DIAGNOSIS — Z4682 Encounter for fitting and adjustment of non-vascular catheter: Secondary | ICD-10-CM | POA: Diagnosis not present

## 2015-01-17 DIAGNOSIS — K922 Gastrointestinal hemorrhage, unspecified: Secondary | ICD-10-CM | POA: Diagnosis not present

## 2015-01-17 DIAGNOSIS — I4891 Unspecified atrial fibrillation: Secondary | ICD-10-CM | POA: Diagnosis not present

## 2015-01-17 DIAGNOSIS — J189 Pneumonia, unspecified organism: Secondary | ICD-10-CM | POA: Diagnosis not present

## 2015-01-17 DIAGNOSIS — N201 Calculus of ureter: Secondary | ICD-10-CM | POA: Diagnosis not present

## 2015-01-17 DIAGNOSIS — J9690 Respiratory failure, unspecified, unspecified whether with hypoxia or hypercapnia: Secondary | ICD-10-CM | POA: Diagnosis not present

## 2015-01-17 DIAGNOSIS — R6521 Severe sepsis with septic shock: Secondary | ICD-10-CM | POA: Diagnosis not present

## 2015-01-17 DIAGNOSIS — Z951 Presence of aortocoronary bypass graft: Secondary | ICD-10-CM | POA: Diagnosis not present

## 2015-01-17 DIAGNOSIS — R069 Unspecified abnormalities of breathing: Secondary | ICD-10-CM | POA: Diagnosis not present

## 2015-01-17 DIAGNOSIS — K921 Melena: Secondary | ICD-10-CM | POA: Diagnosis not present

## 2015-01-17 DIAGNOSIS — R569 Unspecified convulsions: Secondary | ICD-10-CM | POA: Diagnosis not present

## 2015-01-17 DIAGNOSIS — N10 Acute tubulo-interstitial nephritis: Secondary | ICD-10-CM | POA: Diagnosis not present

## 2015-01-17 DIAGNOSIS — R7881 Bacteremia: Secondary | ICD-10-CM | POA: Diagnosis not present

## 2015-01-17 DIAGNOSIS — I2510 Atherosclerotic heart disease of native coronary artery without angina pectoris: Secondary | ICD-10-CM | POA: Diagnosis not present

## 2015-01-17 DIAGNOSIS — R32 Unspecified urinary incontinence: Secondary | ICD-10-CM | POA: Diagnosis not present

## 2015-01-17 DIAGNOSIS — T45515A Adverse effect of anticoagulants, initial encounter: Secondary | ICD-10-CM | POA: Diagnosis not present

## 2015-01-17 DIAGNOSIS — I10 Essential (primary) hypertension: Secondary | ICD-10-CM | POA: Diagnosis not present

## 2015-01-17 DIAGNOSIS — K5730 Diverticulosis of large intestine without perforation or abscess without bleeding: Secondary | ICD-10-CM | POA: Diagnosis not present

## 2015-01-17 DIAGNOSIS — Z87442 Personal history of urinary calculi: Secondary | ICD-10-CM | POA: Diagnosis not present

## 2015-01-17 DIAGNOSIS — I69391 Dysphagia following cerebral infarction: Secondary | ICD-10-CM | POA: Diagnosis not present

## 2015-01-17 DIAGNOSIS — R0602 Shortness of breath: Secondary | ICD-10-CM | POA: Diagnosis not present

## 2015-01-17 DIAGNOSIS — J690 Pneumonitis due to inhalation of food and vomit: Secondary | ICD-10-CM | POA: Diagnosis not present

## 2015-01-17 DIAGNOSIS — J9811 Atelectasis: Secondary | ICD-10-CM | POA: Diagnosis not present

## 2015-01-17 DIAGNOSIS — N111 Chronic obstructive pyelonephritis: Secondary | ICD-10-CM | POA: Diagnosis not present

## 2015-01-17 DIAGNOSIS — Z87440 Personal history of urinary (tract) infections: Secondary | ICD-10-CM | POA: Diagnosis not present

## 2015-01-17 DIAGNOSIS — Z452 Encounter for adjustment and management of vascular access device: Secondary | ICD-10-CM | POA: Diagnosis not present

## 2015-01-17 DIAGNOSIS — B9620 Unspecified Escherichia coli [E. coli] as the cause of diseases classified elsewhere: Secondary | ICD-10-CM | POA: Diagnosis not present

## 2015-01-17 DIAGNOSIS — I69354 Hemiplegia and hemiparesis following cerebral infarction affecting left non-dominant side: Secondary | ICD-10-CM | POA: Diagnosis not present

## 2015-01-17 DIAGNOSIS — D649 Anemia, unspecified: Secondary | ICD-10-CM | POA: Diagnosis not present

## 2015-01-17 DIAGNOSIS — R1310 Dysphagia, unspecified: Secondary | ICD-10-CM | POA: Diagnosis not present

## 2015-01-17 DIAGNOSIS — I252 Old myocardial infarction: Secondary | ICD-10-CM | POA: Diagnosis not present

## 2015-01-17 DIAGNOSIS — Z8673 Personal history of transient ischemic attack (TIA), and cerebral infarction without residual deficits: Secondary | ICD-10-CM | POA: Diagnosis not present

## 2015-01-17 DIAGNOSIS — N200 Calculus of kidney: Secondary | ICD-10-CM | POA: Diagnosis not present

## 2015-01-17 DIAGNOSIS — E870 Hyperosmolality and hypernatremia: Secondary | ICD-10-CM | POA: Diagnosis not present

## 2015-01-17 DIAGNOSIS — N390 Urinary tract infection, site not specified: Secondary | ICD-10-CM | POA: Diagnosis not present

## 2015-01-17 DIAGNOSIS — R109 Unspecified abdominal pain: Secondary | ICD-10-CM | POA: Diagnosis not present

## 2015-01-17 DIAGNOSIS — A419 Sepsis, unspecified organism: Secondary | ICD-10-CM | POA: Diagnosis not present

## 2015-01-26 DIAGNOSIS — J918 Pleural effusion in other conditions classified elsewhere: Secondary | ICD-10-CM | POA: Diagnosis not present

## 2015-01-26 DIAGNOSIS — N390 Urinary tract infection, site not specified: Secondary | ICD-10-CM | POA: Diagnosis not present

## 2015-01-26 DIAGNOSIS — I252 Old myocardial infarction: Secondary | ICD-10-CM | POA: Diagnosis not present

## 2015-01-26 DIAGNOSIS — J9690 Respiratory failure, unspecified, unspecified whether with hypoxia or hypercapnia: Secondary | ICD-10-CM | POA: Diagnosis not present

## 2015-01-26 DIAGNOSIS — G40909 Epilepsy, unspecified, not intractable, without status epilepticus: Secondary | ICD-10-CM | POA: Diagnosis not present

## 2015-01-26 DIAGNOSIS — I69954 Hemiplegia and hemiparesis following unspecified cerebrovascular disease affecting left non-dominant side: Secondary | ICD-10-CM | POA: Diagnosis not present

## 2015-01-26 DIAGNOSIS — I25119 Atherosclerotic heart disease of native coronary artery with unspecified angina pectoris: Secondary | ICD-10-CM | POA: Diagnosis not present

## 2015-01-26 DIAGNOSIS — R1310 Dysphagia, unspecified: Secondary | ICD-10-CM | POA: Diagnosis not present

## 2015-01-26 DIAGNOSIS — R7881 Bacteremia: Secondary | ICD-10-CM | POA: Diagnosis not present

## 2015-01-26 DIAGNOSIS — N200 Calculus of kidney: Secondary | ICD-10-CM | POA: Diagnosis not present

## 2015-01-26 DIAGNOSIS — J690 Pneumonitis due to inhalation of food and vomit: Secondary | ICD-10-CM | POA: Diagnosis not present

## 2015-01-26 DIAGNOSIS — I10 Essential (primary) hypertension: Secondary | ICD-10-CM | POA: Diagnosis not present

## 2015-01-26 DIAGNOSIS — K922 Gastrointestinal hemorrhage, unspecified: Secondary | ICD-10-CM | POA: Diagnosis not present

## 2015-01-26 DIAGNOSIS — Z952 Presence of prosthetic heart valve: Secondary | ICD-10-CM | POA: Diagnosis not present

## 2015-01-26 DIAGNOSIS — R109 Unspecified abdominal pain: Secondary | ICD-10-CM | POA: Diagnosis not present

## 2015-01-26 DIAGNOSIS — Z8673 Personal history of transient ischemic attack (TIA), and cerebral infarction without residual deficits: Secondary | ICD-10-CM | POA: Diagnosis not present

## 2015-01-26 DIAGNOSIS — R918 Other nonspecific abnormal finding of lung field: Secondary | ICD-10-CM | POA: Diagnosis not present

## 2015-01-27 DIAGNOSIS — J690 Pneumonitis due to inhalation of food and vomit: Secondary | ICD-10-CM | POA: Diagnosis not present

## 2015-01-27 DIAGNOSIS — N390 Urinary tract infection, site not specified: Secondary | ICD-10-CM | POA: Diagnosis not present

## 2015-01-27 DIAGNOSIS — R1310 Dysphagia, unspecified: Secondary | ICD-10-CM | POA: Diagnosis not present

## 2015-01-27 DIAGNOSIS — R7881 Bacteremia: Secondary | ICD-10-CM | POA: Diagnosis not present

## 2015-01-31 DIAGNOSIS — I69954 Hemiplegia and hemiparesis following unspecified cerebrovascular disease affecting left non-dominant side: Secondary | ICD-10-CM | POA: Diagnosis not present

## 2015-01-31 DIAGNOSIS — Z8673 Personal history of transient ischemic attack (TIA), and cerebral infarction without residual deficits: Secondary | ICD-10-CM | POA: Diagnosis not present

## 2015-01-31 DIAGNOSIS — I25119 Atherosclerotic heart disease of native coronary artery with unspecified angina pectoris: Secondary | ICD-10-CM | POA: Diagnosis not present

## 2015-01-31 DIAGNOSIS — I252 Old myocardial infarction: Secondary | ICD-10-CM | POA: Diagnosis not present

## 2015-01-31 DIAGNOSIS — I10 Essential (primary) hypertension: Secondary | ICD-10-CM | POA: Diagnosis not present

## 2015-01-31 DIAGNOSIS — N200 Calculus of kidney: Secondary | ICD-10-CM | POA: Diagnosis not present

## 2015-01-31 DIAGNOSIS — Z952 Presence of prosthetic heart valve: Secondary | ICD-10-CM | POA: Diagnosis not present

## 2015-01-31 DIAGNOSIS — G40909 Epilepsy, unspecified, not intractable, without status epilepticus: Secondary | ICD-10-CM | POA: Diagnosis not present

## 2015-02-10 DIAGNOSIS — N3080 Other cystitis without hematuria: Secondary | ICD-10-CM | POA: Diagnosis not present

## 2015-02-10 DIAGNOSIS — N3020 Other chronic cystitis without hematuria: Secondary | ICD-10-CM | POA: Diagnosis not present

## 2015-02-10 DIAGNOSIS — N200 Calculus of kidney: Secondary | ICD-10-CM | POA: Diagnosis not present

## 2015-02-10 DIAGNOSIS — N319 Neuromuscular dysfunction of bladder, unspecified: Secondary | ICD-10-CM | POA: Diagnosis not present

## 2015-02-11 DIAGNOSIS — N390 Urinary tract infection, site not specified: Secondary | ICD-10-CM | POA: Diagnosis not present

## 2015-02-12 DIAGNOSIS — I509 Heart failure, unspecified: Secondary | ICD-10-CM | POA: Diagnosis not present

## 2015-02-12 DIAGNOSIS — D72829 Elevated white blood cell count, unspecified: Secondary | ICD-10-CM | POA: Diagnosis not present

## 2015-02-14 DIAGNOSIS — I2510 Atherosclerotic heart disease of native coronary artery without angina pectoris: Secondary | ICD-10-CM | POA: Diagnosis not present

## 2015-02-14 DIAGNOSIS — I252 Old myocardial infarction: Secondary | ICD-10-CM | POA: Diagnosis not present

## 2015-02-14 DIAGNOSIS — I69354 Hemiplegia and hemiparesis following cerebral infarction affecting left non-dominant side: Secondary | ICD-10-CM | POA: Diagnosis not present

## 2015-02-14 DIAGNOSIS — Z951 Presence of aortocoronary bypass graft: Secondary | ICD-10-CM | POA: Diagnosis not present

## 2015-02-14 DIAGNOSIS — G40909 Epilepsy, unspecified, not intractable, without status epilepticus: Secondary | ICD-10-CM | POA: Diagnosis not present

## 2015-02-14 DIAGNOSIS — I10 Essential (primary) hypertension: Secondary | ICD-10-CM | POA: Diagnosis not present

## 2015-02-14 DIAGNOSIS — J45909 Unspecified asthma, uncomplicated: Secondary | ICD-10-CM | POA: Diagnosis not present

## 2015-02-14 DIAGNOSIS — N200 Calculus of kidney: Secondary | ICD-10-CM | POA: Diagnosis not present

## 2015-02-14 DIAGNOSIS — I209 Angina pectoris, unspecified: Secondary | ICD-10-CM | POA: Diagnosis not present

## 2015-02-20 DIAGNOSIS — N3020 Other chronic cystitis without hematuria: Secondary | ICD-10-CM | POA: Diagnosis not present

## 2015-02-20 DIAGNOSIS — N3080 Other cystitis without hematuria: Secondary | ICD-10-CM | POA: Diagnosis not present

## 2015-02-20 DIAGNOSIS — N319 Neuromuscular dysfunction of bladder, unspecified: Secondary | ICD-10-CM | POA: Diagnosis not present

## 2015-02-20 DIAGNOSIS — N200 Calculus of kidney: Secondary | ICD-10-CM | POA: Diagnosis not present

## 2015-02-24 DIAGNOSIS — R1310 Dysphagia, unspecified: Secondary | ICD-10-CM | POA: Diagnosis not present

## 2015-03-06 DIAGNOSIS — N200 Calculus of kidney: Secondary | ICD-10-CM | POA: Diagnosis not present

## 2015-03-06 DIAGNOSIS — N3080 Other cystitis without hematuria: Secondary | ICD-10-CM | POA: Diagnosis not present

## 2015-03-06 DIAGNOSIS — N319 Neuromuscular dysfunction of bladder, unspecified: Secondary | ICD-10-CM | POA: Diagnosis not present

## 2015-03-12 DIAGNOSIS — I509 Heart failure, unspecified: Secondary | ICD-10-CM | POA: Diagnosis not present

## 2015-03-12 DIAGNOSIS — D649 Anemia, unspecified: Secondary | ICD-10-CM | POA: Diagnosis not present

## 2015-03-31 DEATH — deceased
